# Patient Record
Sex: Female | Born: 1985 | Hispanic: Yes | Marital: Single
Health system: Southern US, Community
[De-identification: ages and names within clinical notes are randomized; demographics above are authoritative.]

---

## 2018-04-20 ENCOUNTER — Emergency Department (HOSPITAL_COMMUNITY)
Admission: EM | Admit: 2018-04-20 | Discharge: 2018-04-20 | Disposition: A | Discharge status: Home / Self Care | Payer: Self-pay | Attending: Emergency Medicine | Admitting: Emergency Medicine

## 2018-04-20 ENCOUNTER — Encounter (HOSPITAL_COMMUNITY): Payer: Self-pay | Admitting: Emergency Medicine

## 2018-04-20 ENCOUNTER — Emergency Department (HOSPITAL_COMMUNITY): Payer: Self-pay

## 2018-04-20 ENCOUNTER — Other Ambulatory Visit: Payer: Self-pay

## 2018-04-20 DIAGNOSIS — R1011 Right upper quadrant pain: Secondary | ICD-10-CM | POA: Insufficient documentation

## 2018-04-20 DIAGNOSIS — Z79899 Other long term (current) drug therapy: Secondary | ICD-10-CM | POA: Insufficient documentation

## 2018-04-20 LAB — URINALYSIS, ROUTINE W REFLEX MICROSCOPIC
Glucose, UA: NEGATIVE mg/dL
Ketones, ur: 5 mg/dL — AB
Nitrite: NEGATIVE
PROTEIN: 100 mg/dL — AB
SPECIFIC GRAVITY, URINE: 1.03 (ref 1.005–1.030)
pH: 5 (ref 5.0–8.0)

## 2018-04-20 LAB — CBC
HCT: 39.2 % (ref 36.0–46.0)
HEMOGLOBIN: 12.1 g/dL (ref 12.0–15.0)
MCH: 26.1 pg (ref 26.0–34.0)
MCHC: 30.9 g/dL (ref 30.0–36.0)
MCV: 84.7 fL (ref 78.0–100.0)
Platelets: 264 10*3/uL (ref 150–400)
RBC: 4.63 MIL/uL (ref 3.87–5.11)
RDW: 13.5 % (ref 11.5–15.5)
WBC: 9.3 10*3/uL (ref 4.0–10.5)

## 2018-04-20 LAB — COMPREHENSIVE METABOLIC PANEL
ALBUMIN: 3.9 g/dL (ref 3.5–5.0)
ALK PHOS: 59 U/L (ref 38–126)
ALT: 53 U/L (ref 14–54)
ANION GAP: 9 (ref 5–15)
AST: 43 U/L — AB (ref 15–41)
BILIRUBIN TOTAL: 0.3 mg/dL (ref 0.3–1.2)
BUN: 9 mg/dL (ref 6–20)
CALCIUM: 9 mg/dL (ref 8.9–10.3)
CO2: 23 mmol/L (ref 22–32)
CREATININE: 0.65 mg/dL (ref 0.44–1.00)
Chloride: 108 mmol/L (ref 101–111)
GFR calc Af Amer: >60 mL/min (ref >60)
GFR calc non Af Amer: >60 mL/min (ref >60)
GLUCOSE: 90 mg/dL (ref 65–99)
Potassium: 4 mmol/L (ref 3.5–5.1)
Sodium: 140 mmol/L (ref 135–145)
Total Protein: 6.8 g/dL (ref 6.5–8.1)

## 2018-04-20 LAB — I-STAT BETA HCG BLOOD, ED (MC, WL, AP ONLY)

## 2018-04-20 LAB — LIPASE, BLOOD: Lipase: 27 U/L (ref 11–51)

## 2018-04-20 MED ORDER — SODIUM CHLORIDE 0.9 % IV BOLUS
1000.0000 mL | Freq: Once | INTRAVENOUS | Status: AC
  Administered 2018-04-20: 1000 mL via INTRAVENOUS

## 2018-04-20 MED ORDER — PANTOPRAZOLE SODIUM 20 MG PO TBEC: 20.0000 mg | DELAYED_RELEASE_TABLET | Freq: Every day | ORAL | 1 refills | Status: AC

## 2018-04-20 MED ORDER — ONDANSETRON HCL 4 MG/2ML IJ SOLN
4.0000 mg | Freq: Once | INTRAMUSCULAR | Status: AC
  Administered 2018-04-20: 4 mg via INTRAVENOUS
  Filled 2018-04-20: qty 2

## 2018-04-20 MED ORDER — FAMOTIDINE 20 MG PO TABS: 20.0000 mg | ORAL_TABLET | Freq: Two times a day (BID) | ORAL | 0 refills | Status: AC

## 2018-04-20 NOTE — Discharge Instructions (Signed)
See the attached follow up phone number to follow up with a family doctor Your ultrasound was normal - your gall bladder and liver are normal - your blood work was very normal as well  Protonix daily for 1 month Pepcid twice daily for 2 weeks, then daily after that ER for worsening symptoms including pain / vomiting or jaundice (turning yellow) See your doctor in 2-3 days for follow up if no better.  Take a bland diet with lots of hydration (water) Avoid ibuprofen / soda / alcohol

## 2018-04-20 NOTE — ED Triage Notes (Signed)
Pt states RUQ abdominal pain with rebound tenderness on assessment, pt has nausea with emesis. Pt reports intermittent fever chills, no fever at triage.

## 2018-04-20 NOTE — ED Notes (Signed)
Patient transported to Ultrasound 

## 2018-04-20 NOTE — ED Provider Notes (Signed)
[facility] Provider Note   CSN: [id number] Arrival date & time: 04/20/18  1230     History   Chief Complaint Chief Complaint  Patient presents with  . Abdominal Pain  . Emesis    HPI Tanya Hunt is a 32 y.o. female.  HPI  32 y/o female - she has hx of no chronic PMH - no meds and no surgery - She presents with abdominal pain in the right upper quadrant which has been present for 2 days, persistent, no radiation, associated nausea but no vomiting diarrhea or difficulty with urination.  She was seen at the urgent care yesterday, she had a urinalysis and labs which were unremarkable, she reports that she was called today to tell her that she had one test that was low but she cannot remember which one it was.  She has not had anything to eat today, she did have some orange juice this morning, it did not make things any worse.  She denies jaundice, rashes, back pain, coughing, shortness of breath or any other complaints.  History reviewed. No pertinent past medical history.  No chronic past medical history  There are no active problems to display for this patient.     OB History   None      Home Medications    Prior to Admission medications   Medication Sig Start Date End Date Taking? Authorizing Provider  acetaminophen (TYLENOL) 500 MG tablet Take 500 mg by mouth every 6 (six) hours as needed for mild pain, moderate pain, fever or headache.   Yes [provider]  cyclobenzaprine (FLEXERIL) 10 MG tablet Take 10 mg by mouth 3 (three) times daily as needed for muscle spasms.   Yes [provider]  famotidine (PEPCID) 20 MG tablet Take 1 tablet (20 mg total) by mouth 2 (two) times daily. 04/20/18   Eber HongMiller, Iya Hamed, MD  pantoprazole (PROTONIX) 20 MG tablet Take 1 tablet (20 mg total) by mouth daily. 04/20/18   Eber HongMiller, Knox Cervi, MD    Family History No family history on file.  Social History  The patient denies tobacco  or alcohol use  Social History   Tobacco Use  . Smoking status: Not on file  Substance Use Topics  . Alcohol use: Not on file  . Drug use: Not on file     Allergies   Patient has no known allergies.   Review of Systems Review of Systems  All other systems reviewed and are negative.    Physical Exam Updated Vital Signs BP 101/70   Pulse (!) 59   Temp 97.6 F (36.4 C) (Oral)   Resp 16   Ht 5\' 2"  (1.575 m)   Wt 59 kg (130 lb)   LMP 04/20/2018   SpO2 100%   BMI 23.78 kg/m   Physical Exam  Constitutional: She appears well-developed and well-nourished. No distress.  HENT:  Head: Normocephalic and atraumatic.  Mouth/Throat: Oropharynx is clear and moist. No oropharyngeal exudate.  Eyes: Pupils are equal, round, and reactive to light. Conjunctivae and EOM are normal. Right eye exhibits no discharge. Left eye exhibits no discharge. No scleral icterus.  Neck: Normal range of motion. Neck supple. No JVD present. No thyromegaly present.  Cardiovascular: Normal rate, regular rhythm, normal heart sounds and intact distal pulses. Exam reveals no gallop and no friction rub.  No murmur heard. Pulmonary/Chest: Effort normal and breath sounds normal. No respiratory distress. She has no wheezes. She has no rales.  Abdominal:  Soft. Bowel sounds are normal. She exhibits no distension and no mass. There is tenderness ( Right upper quadrant tenderness to palpation but no Murphy sign, no other abdominal tenderness, very soft).  Musculoskeletal: Normal range of motion. She exhibits no edema or tenderness.  Lymphadenopathy:    She has no cervical adenopathy.  Neurological: She is alert. Coordination normal.  Skin: Skin is warm and dry. No rash noted. No erythema.  Psychiatric: She has a normal mood and affect. Her behavior is normal.  Nursing note and vitals reviewed.    ED Treatments / Results  Labs (all labs ordered are listed, but only abnormal results are displayed) Labs Reviewed   COMPREHENSIVE METABOLIC PANEL - Abnormal; Notable for the following components:      Result Value   AST 43 (*)    All other components within normal limits  URINALYSIS, ROUTINE W REFLEX MICROSCOPIC - Abnormal; Notable for the following components:   APPearance HAZY (*)    Hgb urine dipstick MODERATE (*)    Bilirubin Urine SMALL (*)    Ketones, ur 5 (*)    Protein, ur 100 (*)    Leukocytes, UA SMALL (*)    Bacteria, UA RARE (*)    All other components within normal limits  LIPASE, BLOOD  CBC  I-STAT BETA HCG BLOOD, ED ([facility], [facility], [facility] ONLY)    EKG None  Radiology US Abdomen Limited  Result Date: 04/20/2018 CLINICAL DATA:  Right upper quadrant pain EXAM: ULTRASOUND ABDOMEN LIMITED RIGHT UPPER QUADRANT COMPARISON:  None. FINDINGS: Gallbladder: No gallstones or wall thickening visualized. No sonographic Murphy sign noted by sonographer. Common bile duct: Diameter: 1.5 mm Liver: No focal lesion identified. Within normal limits in parenchymal echogenicity. Portal vein is patent on color Doppler imaging with normal direction of blood flow towards the liver. IMPRESSION: Negative right upper quadrant abdominal ultrasound Electronically Signed   By: Jasmine Pang M.D.   On: 04/20/2018 18:57    Procedures Procedures (including critical care time)  Medications Ordered in ED Medications  sodium chloride 0.9 % bolus 1,000 mL (1,000 mLs Intravenous New Bag/Given 04/20/18 1744)  ondansetron (ZOFRAN) injection 4 mg (4 mg Intravenous Given 04/20/18 1750)     Initial Impression / Assessment and Plan / ED Course  I have reviewed the triage vital signs and the nursing notes.  Pertinent labs & imaging results that were available during my care of the patient were reviewed by me and considered in my medical decision making (see chart for details).  Clinical Course as of Apr 20 1909  Fri Apr 20, 2018  1906 [place]  negative for acute findings Pt will be started on PPI / H2 Pt agreeable. VS stable    [BM]    Clinical Course User Index [BM] Eber Hong, MD    The patient is overall well-appearing, ongoing right upper quadrant pain, labs are unremarkable, check ultrasound, may be related to peptic ulcers or some other benign etiology.  Nonsurgical abdomen  Final Clinical Impressions(s) / ED Diagnoses   Final diagnoses:  RUQ pain    ED Discharge Orders        Ordered    pantoprazole (PROTONIX) 20 MG tablet  Daily     04/20/18 1908    famotidine (PEPCID) 20 MG tablet  2 times daily     04/20/18 1908       Eber Hong, MD 04/20/18 1911

## 2019-02-27 IMAGING — US US ABDOMEN LIMITED
1 series · 14 of 25 positions shown · non-contrast
Comparison: None.

CLINICAL DATA: Right upper quadrant pain

EXAM:
ULTRASOUND ABDOMEN LIMITED RIGHT UPPER QUADRANT

[Series 1: us abdomen limited · 0.17mm/px · 14 of 33 slices shown]
[im 1/33]
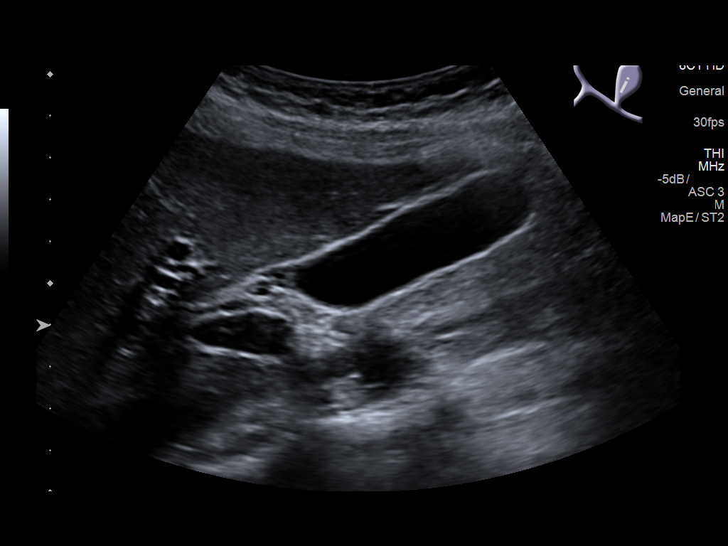
[im 3/33]
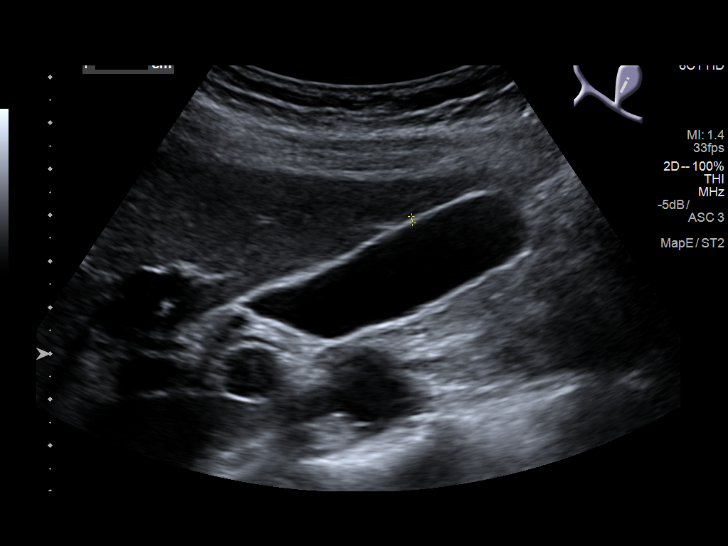
[im 6/33]
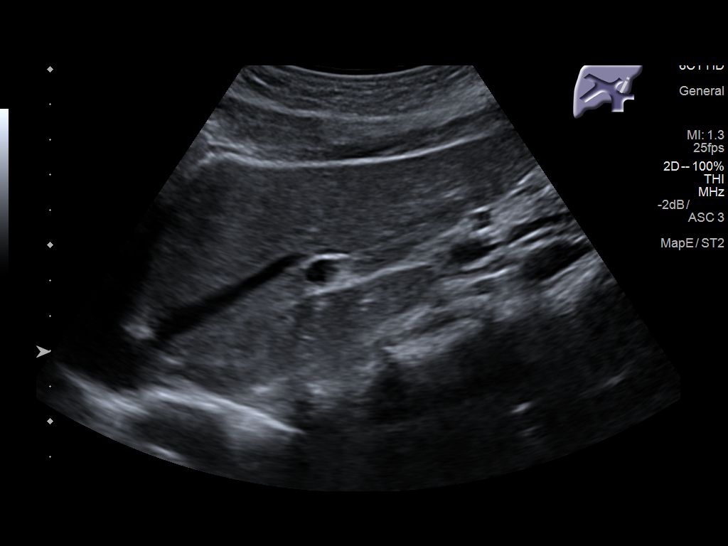
[im 9/33]
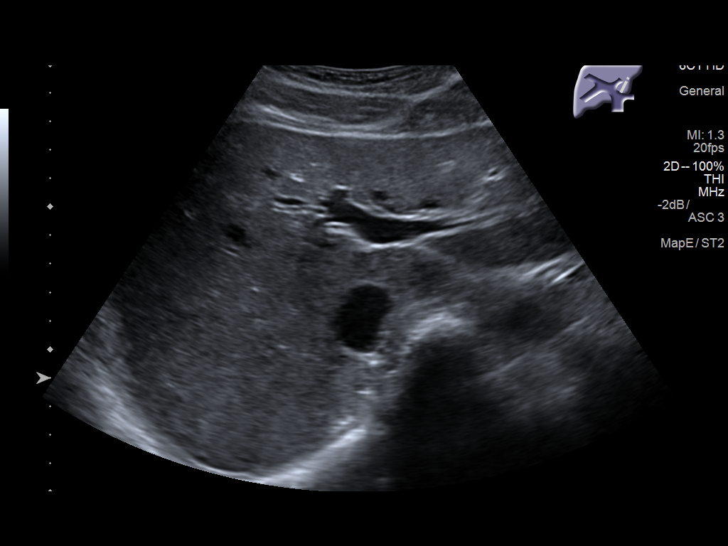
[im 11/33]
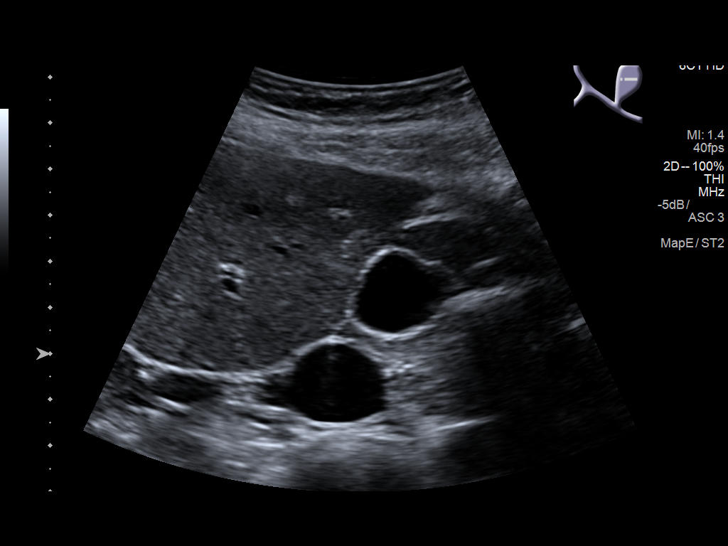
[im 13/33]
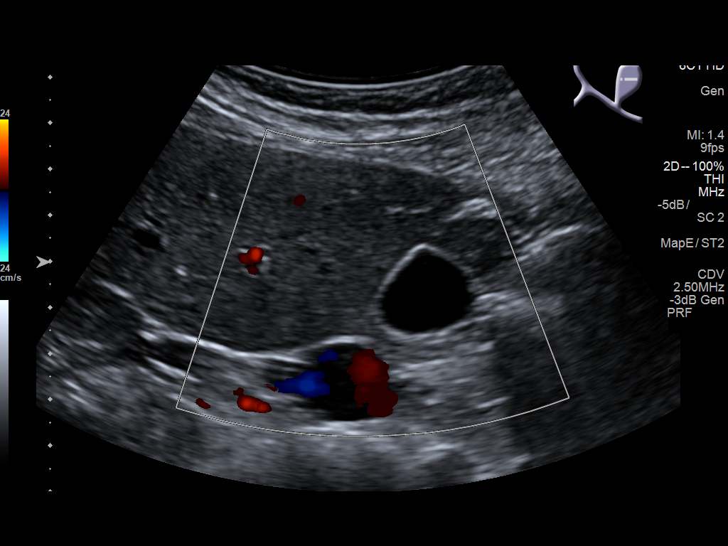
[im 15/33]
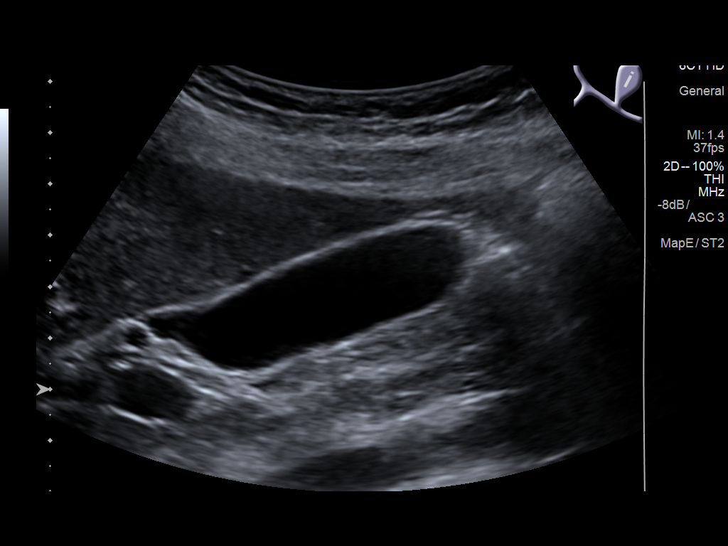
[im 18/33]
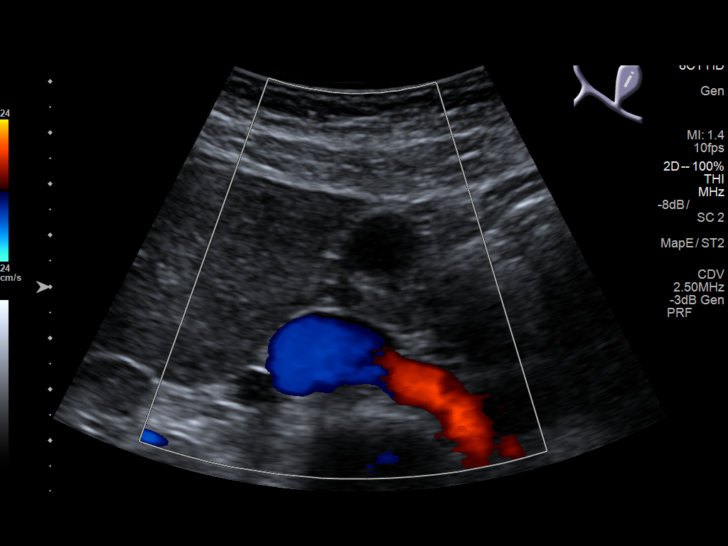
[im 21/33]
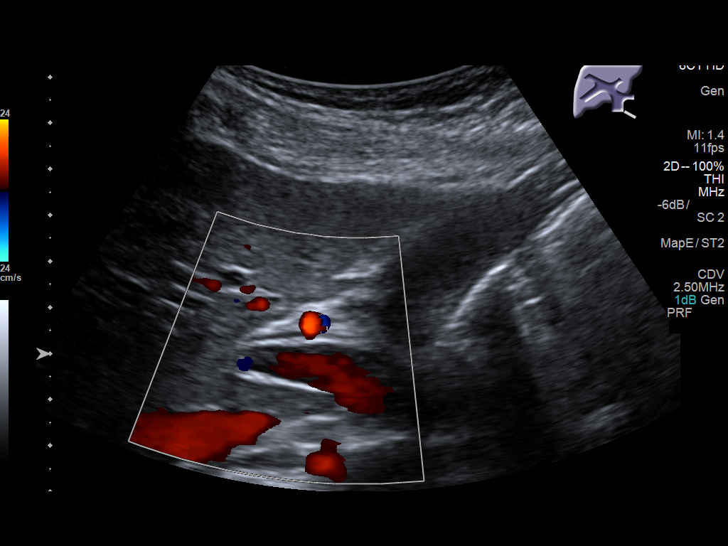
[im 22/33]
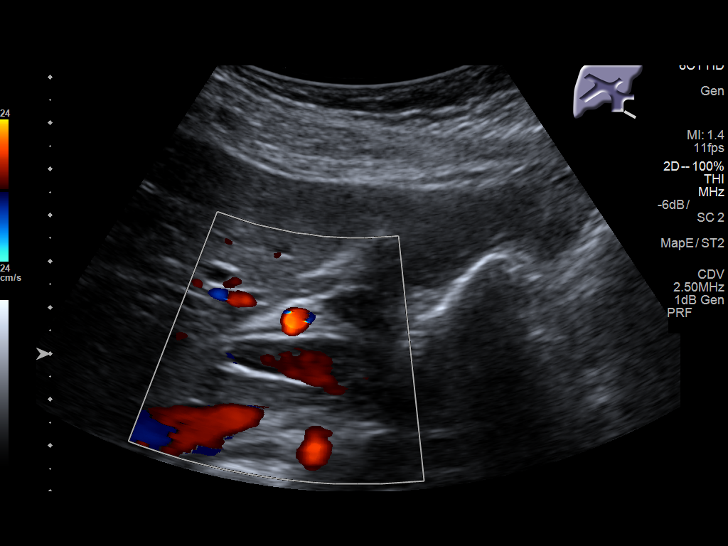
[im 25/33]
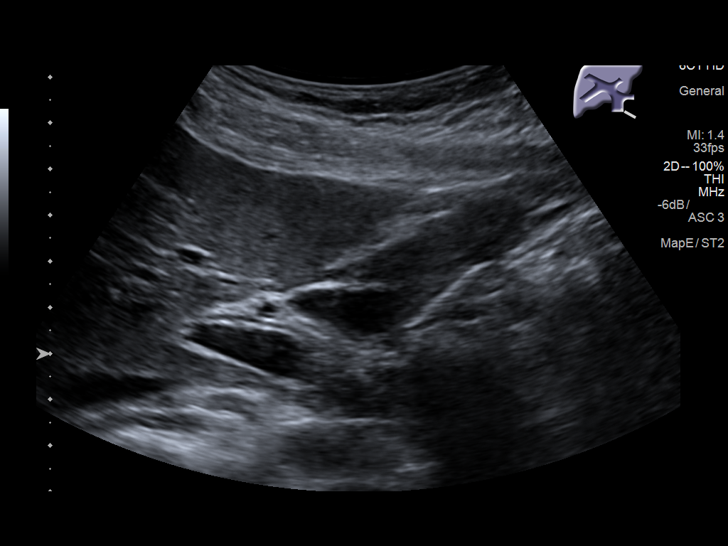
[im 27/33]
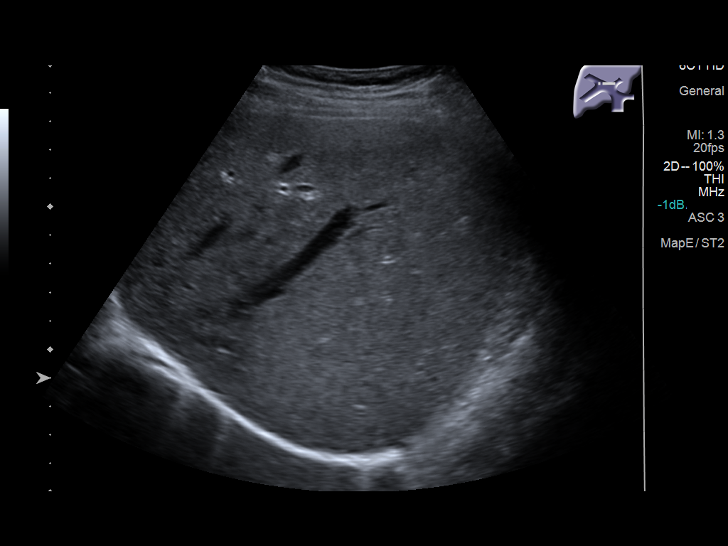
[im 30/33]
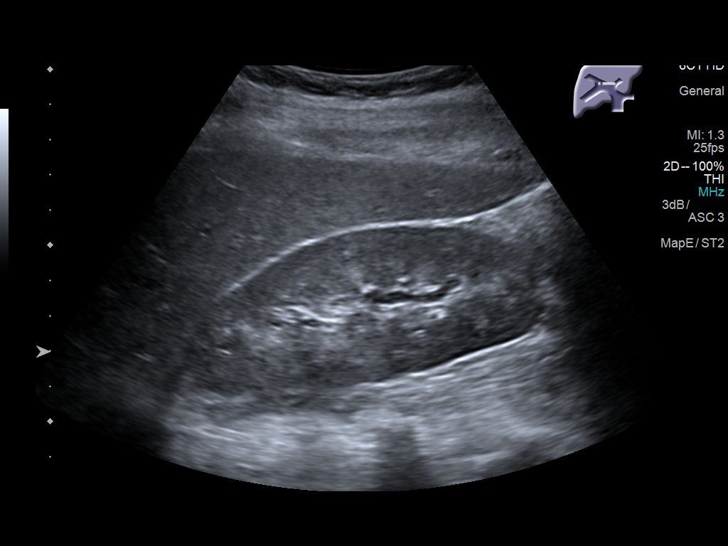
[im 33/33]
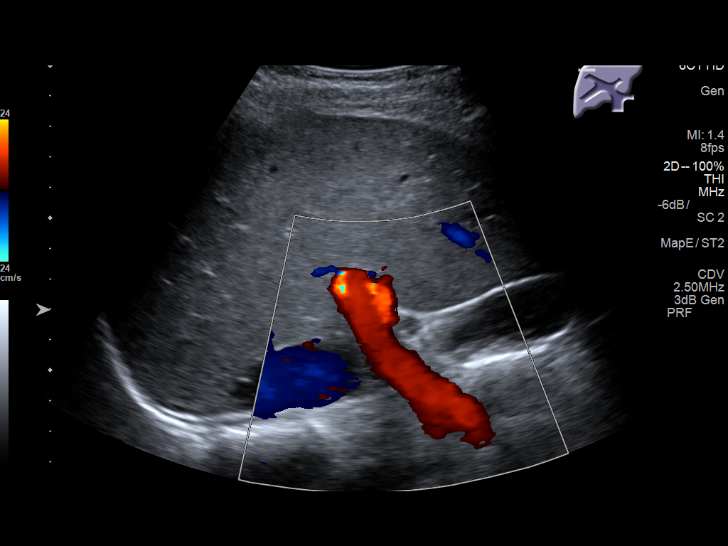

[14 of 25 positions shown; findings below may reference images not displayed]

FINDINGS: Gallbladder:

No gallstones or wall thickening visualized. No sonographic Murphy
sign noted by sonographer.

Common bile duct:

Diameter: 1.5 mm

Liver:

No focal lesion identified. Within normal limits in parenchymal
echogenicity. Portal vein is patent on color Doppler imaging with
normal direction of blood flow towards the liver.
IMPRESSION: Negative right upper quadrant abdominal ultrasound

## 2024-09-13 ENCOUNTER — Encounter: Payer: Self-pay | Admitting: Podiatry

## 2024-09-13 ENCOUNTER — Ambulatory Visit: Admitting: Podiatry

## 2024-09-13 ENCOUNTER — Other Ambulatory Visit: Payer: Self-pay

## 2024-09-13 ENCOUNTER — Ambulatory Visit

## 2024-09-13 ENCOUNTER — Ambulatory Visit (INDEPENDENT_AMBULATORY_CARE_PROVIDER_SITE_OTHER)

## 2024-09-13 DIAGNOSIS — S93492A Sprain of other ligament of left ankle, initial encounter: Secondary | ICD-10-CM

## 2024-09-13 DIAGNOSIS — R262 Difficulty in walking, not elsewhere classified: Secondary | ICD-10-CM | POA: Diagnosis not present

## 2024-09-13 DIAGNOSIS — M7752 Other enthesopathy of left foot: Secondary | ICD-10-CM

## 2024-09-13 DIAGNOSIS — M25572 Pain in left ankle and joints of left foot: Secondary | ICD-10-CM

## 2024-09-13 MED ORDER — MELOXICAM 15 MG PO TABS: 15.0000 mg | ORAL_TABLET | Freq: Every day | ORAL | 1 refills | Status: AC

## 2024-09-13 MED ORDER — MELOXICAM 15 MG PO TABS: 15.0000 mg | ORAL_TABLET | Freq: Every day | ORAL | 1 refills | Status: DC

## 2024-09-13 NOTE — Progress Notes (Signed)
 Chief Complaint  Patient presents with   Foot Pain    L foot pain medial mid arch x 3 weeks worse wen first bearing weight.  Also twisted ankle on stairs 1st week of Oct. Still sore across ankle bone lateral with certain movements. Not diabetic. No anti coag.    Discussed the use of AI scribe software for clinical note transcription with the patient, who gave verbal consent to proceed.  History of Present Illness Tanya Hunt is a 38 year old female who presents with foot and ankle pain following an injury.  She has been experiencing pain in the bottom of her foot, specifically in the arch, for the past two to three weeks. There was no specific injury to the foot that initiated the pain, and it has not improved over time.  In early October, she twisted her ankle after stepping off a step in the dark, which led to swelling but no bruising. Persistent pain has been present since the injury. She has been using ice for the swelling and elevating the ankle when possible. She has not taken any anti-inflammatory medications or used compression wraps.  The pain affects her walking, especially when trying to walk fast or run, and she sometimes catches herself walking differently. She wears sneakers for work but does not use any special arch supports beyond the standard inserts that came with the shoes.  She rates the pain as a six out of ten. She recalls a previous ankle injury from high school while playing soccer, which left her ankle feeling weak, although she did not seek medical attention at that time.  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 She does not drive a stick shift vehicle.  History reviewed. No pertinent past medical history. History reviewed. No pertinent surgical history. No Known Allergies  Physical Exam EXTREMITIES: Pulses intact. MUSCULOSKELETAL: Pain on plantar flexion and inversion of foot along the lateral left rear  foot/ankle area. Pain on dorsiflexion of left big toe and the plantar arch. Achilles tendon non-tender. Pain on resisted plantar flexion and dorsiflexion of ankle.  Pain on palpation to the ATFL on the left.  Pain on palpation to the medial central plantar fascial bands on the left.  Manual muscle testing deferred secondary to pain.   Results RADIOLOGY Left ankle 3 view x-ray and left foot 2 view x-ray (09/13/2024): No fractures, normal osseous structures, probable soft tissue injury  Assessment/Plan of Care: 1. Sprain of anterior talofibular ligament of left ankle, initial encounter   2. Capsulitis of metatarsophalangeal (MTP) joint of left foot   3. Difficulty walking   4. Pain in left ankle joint increased with plantarflexion     Meds ordered this encounter  Medications   DISCONTD: meloxicam (MOBIC) 15 MG tablet    Sig: Take 1 tablet (15 mg total) by mouth daily.    Dispense:  30 tablet    Refill:  1   meloxicam (MOBIC) 15 MG tablet    Sig: Take 1 tablet (15 mg total) by mouth daily.    Dispense:  30 tablet    Refill:  1   PR PNEUMAT WALKING BOOT PRE CST Assessment & Plan Left ankle sprain Chronic left ankle pain following a sprain in early October, with persistent pain rated at 6/10. No fracture on x-ray. Likely soft tissue damage. Previous ankle injury in high school with residual weakness. Current sprain more severe than past injuries, though prior injury may have been  worsened by lack of proper treatment. - Provided pneumatic cam walker for immobilization at home and potentially at work.  Patient was fitted for a prefabricated pneumatic cam walker today.  She was shown how to use this and will wear it at all times weightbearing.  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  Soil scientist and proof of delivery was reviewed and signed by the patient via motion MD. - Advised wearing the cam walker for most of the  time when ambulatory, except during sleep and showering. - Provided a doctor's note for work to allow wearing the cam walker. - Will schedule follow-up in two weeks to assess progress and consider transitioning to an ankle sleeve or milder support. - Discussed potential for formal physical therapy or strengthening exercises with [employer] post-immobilization. - Will consider MRI if symptoms persist post-therapy to evaluate tendon and ligament integrity.  Left foot plantar fasciitis Pain in the arch of the left foot, possibly related to altered gait from ankle sprain. Plantar fascia strain suspected, but exact onset unclear. Immobilization expected to aid recovery. - Prescribed anti-inflammatories for pain and inflammation management. - Provided educational printout on plantar fasciitis, including potential exercises to avoid during immobilization. - Plan to reassess plantar fasciitis after ankle immobilization period to determine need for further intervention.   Awanda CHARM Imperial, DPM, FACFAS [facility]     [address] [place], [place] [address]                Office [phone]  Fax [phone]

## 2024-10-02 ENCOUNTER — Ambulatory Visit: Admitting: Podiatry

## 2024-10-02 ENCOUNTER — Encounter: Payer: Self-pay | Admitting: Podiatry

## 2024-10-02 DIAGNOSIS — S93412D Sprain of calcaneofibular ligament of left ankle, subsequent encounter: Secondary | ICD-10-CM | POA: Diagnosis not present

## 2024-10-02 DIAGNOSIS — M722 Plantar fascial fibromatosis: Secondary | ICD-10-CM

## 2024-10-02 NOTE — Progress Notes (Signed)
  Chief Complaint  Patient presents with   Ankle recheck    Left ankle sprain follow up, she reports swelling is going down, but is still having pain with pressure or large steps. Still using RICE. She is tring to stay off of it as much as possible.  We are also addressing possible PF in the same foot. Worse after rest, mornings are not so bad.     Discussed the use of AI scribe software for clinical note transcription with the patient, who gave verbal consent to proceed.  History of Present Illness Amamda Curbow is a 38 year old female who presents for follow-up of left plantar fasciitis and left ankle sprain.  She has been wearing a CAM walker boot for three weeks, which has been beneficial in alleviating symptoms. There is a minor tear in the boot, but she states it remains functional. She does not require any work notes for wearing the boot and is managing well with her current medication regimen, which she believes has a refill available.  She describes soreness on the lateral aspect of her ankle and pressure or tightness in the area.   History reviewed. No pertinent past medical history. History reviewed. No pertinent surgical history. No Known Allergies  Physical Exam EXTREMITIES: Minimal to no swelling.  Palpable pedal pulses.  No ecchymosis or erythema noted.  There is pain on palpation of the calcaneofibular ligament on the left but not of the anterior talofibular or posterior talofibular ligaments.  No pain on palpation of the fifth metatarsal base.  No Achilles pain on palpation.  No posterior tibial tendon pain on palpation.  Endrange ankle dorsiflexion does cause some discomfort/tightness.  Pain on palpation to the medial and central plantar fascial bands.   Assessment/Plan of Care: 1. Sprain of calcaneofibular ligament of left ankle, subsequent encounter   2. Plantar fasciitis of left foot     AMB REFERRAL TO PHYSICAL THERAPY Assessment & Plan Left plantar  fasciitis Improvement in symptoms with continued use of the boot. Minimal to no swelling. Pain primarily in the instep area. Physical therapy recommended to expedite recovery and strengthen the area to prevent recurrence. - Continue wearing the pneumatic cam walker boot for another two weeks. - Referred to physical therapy with Ronal Hun at [facility] for evaluation and treatment. - Allow earlier discontinuation of the boot if physical therapy deems it appropriate based on strength assessment.  History of left ankle sprain Ongoing soreness and pressure in the ankle area. No pain around the Achilles tendon. Improvement noted with continued use of the boot. Physical therapy recommended to aid in recovery and prevent future injuries. - Continue wearing the boot for another two weeks. - Referred to physical therapy with Ronal Hun at [facility] for evaluation and treatment. - Allow earlier discontinuation of the boot if physical therapy deems it appropriate based on strength assessment.  Follow-up in 5 weeks after PT  Angelee Bahr DSABRA Imperial, DPM, FACFAS [facility]     [address] [place], [place] [address]                Office [phone]  Fax [phone]

## 2024-10-16 ENCOUNTER — Telehealth: Payer: Self-pay

## 2024-10-16 NOTE — Telephone Encounter (Signed)
 Patient called stating that she was given referral for PT. She stated that she can't afford that right now and if there is anything that she can do at home

## 2024-11-06 ENCOUNTER — Ambulatory Visit: Admitting: Podiatry
